# Patient Record
Sex: Male | Born: 1985 | Race: White | Hispanic: No | Marital: Single | Smoking: Light tobacco smoker
Health system: Southern US, Community
[De-identification: ages and names within clinical notes are randomized; demographics above are authoritative.]

---

## 2015-06-05 ENCOUNTER — Emergency Department (HOSPITAL_COMMUNITY)
Admission: EM | Admit: 2015-06-05 | Discharge: 2015-06-05 | Discharge status: Against Medical Advice | Payer: No Typology Code available for payment source | Attending: Emergency Medicine | Admitting: Emergency Medicine

## 2015-06-05 ENCOUNTER — Encounter (HOSPITAL_COMMUNITY): Payer: Self-pay | Admitting: Emergency Medicine

## 2015-06-05 ENCOUNTER — Emergency Department (HOSPITAL_COMMUNITY): Payer: No Typology Code available for payment source

## 2015-06-05 DIAGNOSIS — F17200 Nicotine dependence, unspecified, uncomplicated: Secondary | ICD-10-CM | POA: Insufficient documentation

## 2015-06-05 DIAGNOSIS — Y92410 Unspecified street and highway as the place of occurrence of the external cause: Secondary | ICD-10-CM | POA: Insufficient documentation

## 2015-06-05 DIAGNOSIS — Y999 Unspecified external cause status: Secondary | ICD-10-CM | POA: Diagnosis not present

## 2015-06-05 DIAGNOSIS — Y939 Activity, unspecified: Secondary | ICD-10-CM | POA: Insufficient documentation

## 2015-06-05 DIAGNOSIS — Z791 Long term (current) use of non-steroidal anti-inflammatories (NSAID): Secondary | ICD-10-CM | POA: Insufficient documentation

## 2015-06-05 DIAGNOSIS — S32029A Unspecified fracture of second lumbar vertebra, initial encounter for closed fracture: Secondary | ICD-10-CM

## 2015-06-05 DIAGNOSIS — S3992XA Unspecified injury of lower back, initial encounter: Secondary | ICD-10-CM | POA: Diagnosis present
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
